# Patient Record
Sex: Female | Born: 1980 | Race: Black or African American | Hispanic: No | Marital: Single | Smoking: Never smoker
Health system: Southern US, Community
[De-identification: ages and names within clinical notes are randomized; demographics above are authoritative.]

---

## 2008-04-10 ENCOUNTER — Emergency Department: Payer: Self-pay | Admitting: Internal Medicine

## 2008-12-18 ENCOUNTER — Emergency Department: Payer: Self-pay | Admitting: Emergency Medicine

## 2010-04-12 ENCOUNTER — Emergency Department: Payer: Self-pay | Admitting: Emergency Medicine

## 2010-04-15 ENCOUNTER — Emergency Department: Payer: Self-pay | Admitting: Unknown Physician Specialty

## 2010-06-22 ENCOUNTER — Emergency Department: Payer: Self-pay | Admitting: Internal Medicine

## 2010-06-30 ENCOUNTER — Emergency Department: Payer: Self-pay | Admitting: Emergency Medicine

## 2011-09-13 ENCOUNTER — Emergency Department: Payer: Self-pay | Admitting: *Deleted

## 2011-09-13 LAB — BASIC METABOLIC PANEL
Anion Gap: 8 (ref 7–16)
BUN: 13 mg/dL (ref 7–18)
Calcium, Total: 9.3 mg/dL (ref 8.5–10.1)
Co2: 30 mmol/L (ref 21–32)
Creatinine: 0.91 mg/dL (ref 0.60–1.30)
EGFR (African American): >60
EGFR (Non-African Amer.): >60
Glucose: 92 mg/dL (ref 65–99)

## 2011-09-13 LAB — CBC
HGB: 14.6 g/dL (ref 12.0–16.0)
MCH: 27.2 pg (ref 26.0–34.0)
MCV: 83 fL (ref 80–100)
Platelet: 279 10*3/uL (ref 150–440)
RDW: 14.1 % (ref 11.5–14.5)

## 2013-10-23 IMAGING — CR DG CHEST 2V
1 series · 3 of 3 positions shown · non-contrast
Comparison: none

REASON FOR EXAM: chest pain
COMMENTS:

PROCEDURE:     DXR - DXR CHEST PA (OR AP) AND LATERAL  - September 13, 2011 [DATE]
RESULT:     The lung fields are clear.  The heart, mediastinal and osseous
structures reveal no significant abnormalities.

[Series 1: pa · 0.17mm/px · 3 of 3 slices shown]
[im 1/3]
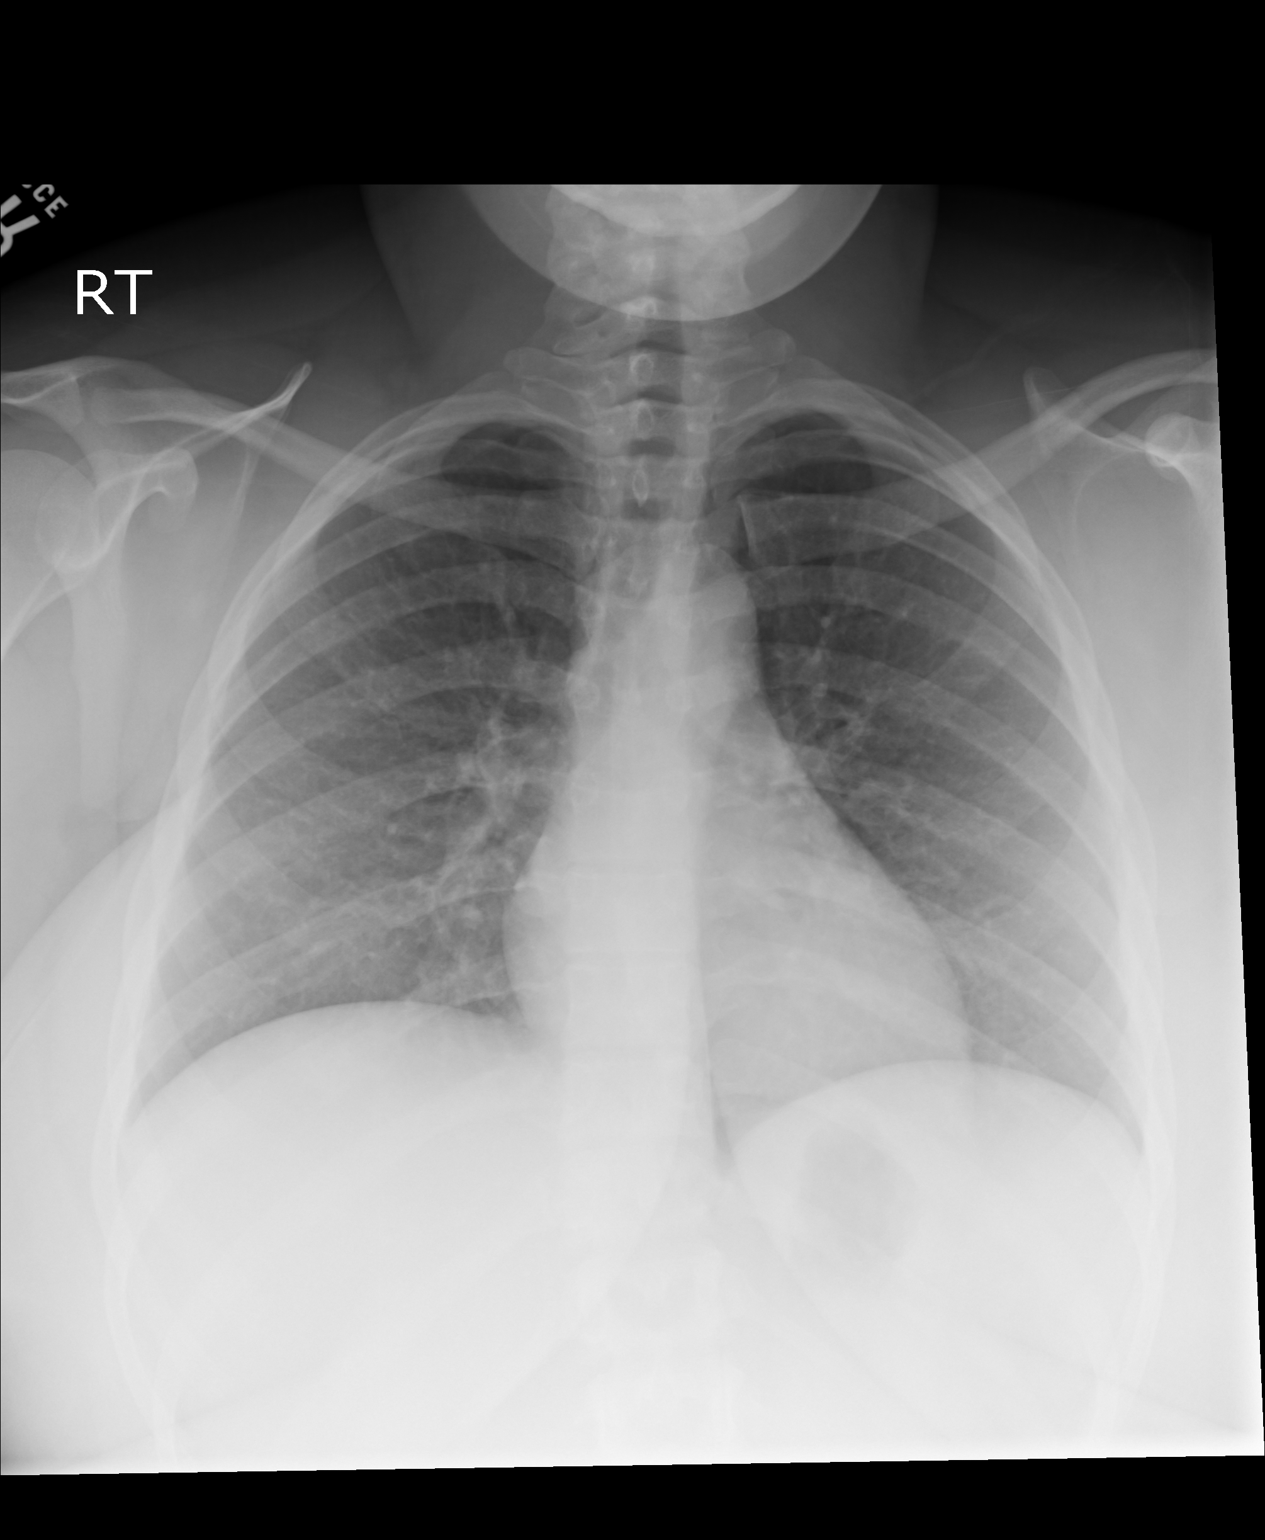
[im 2/3]
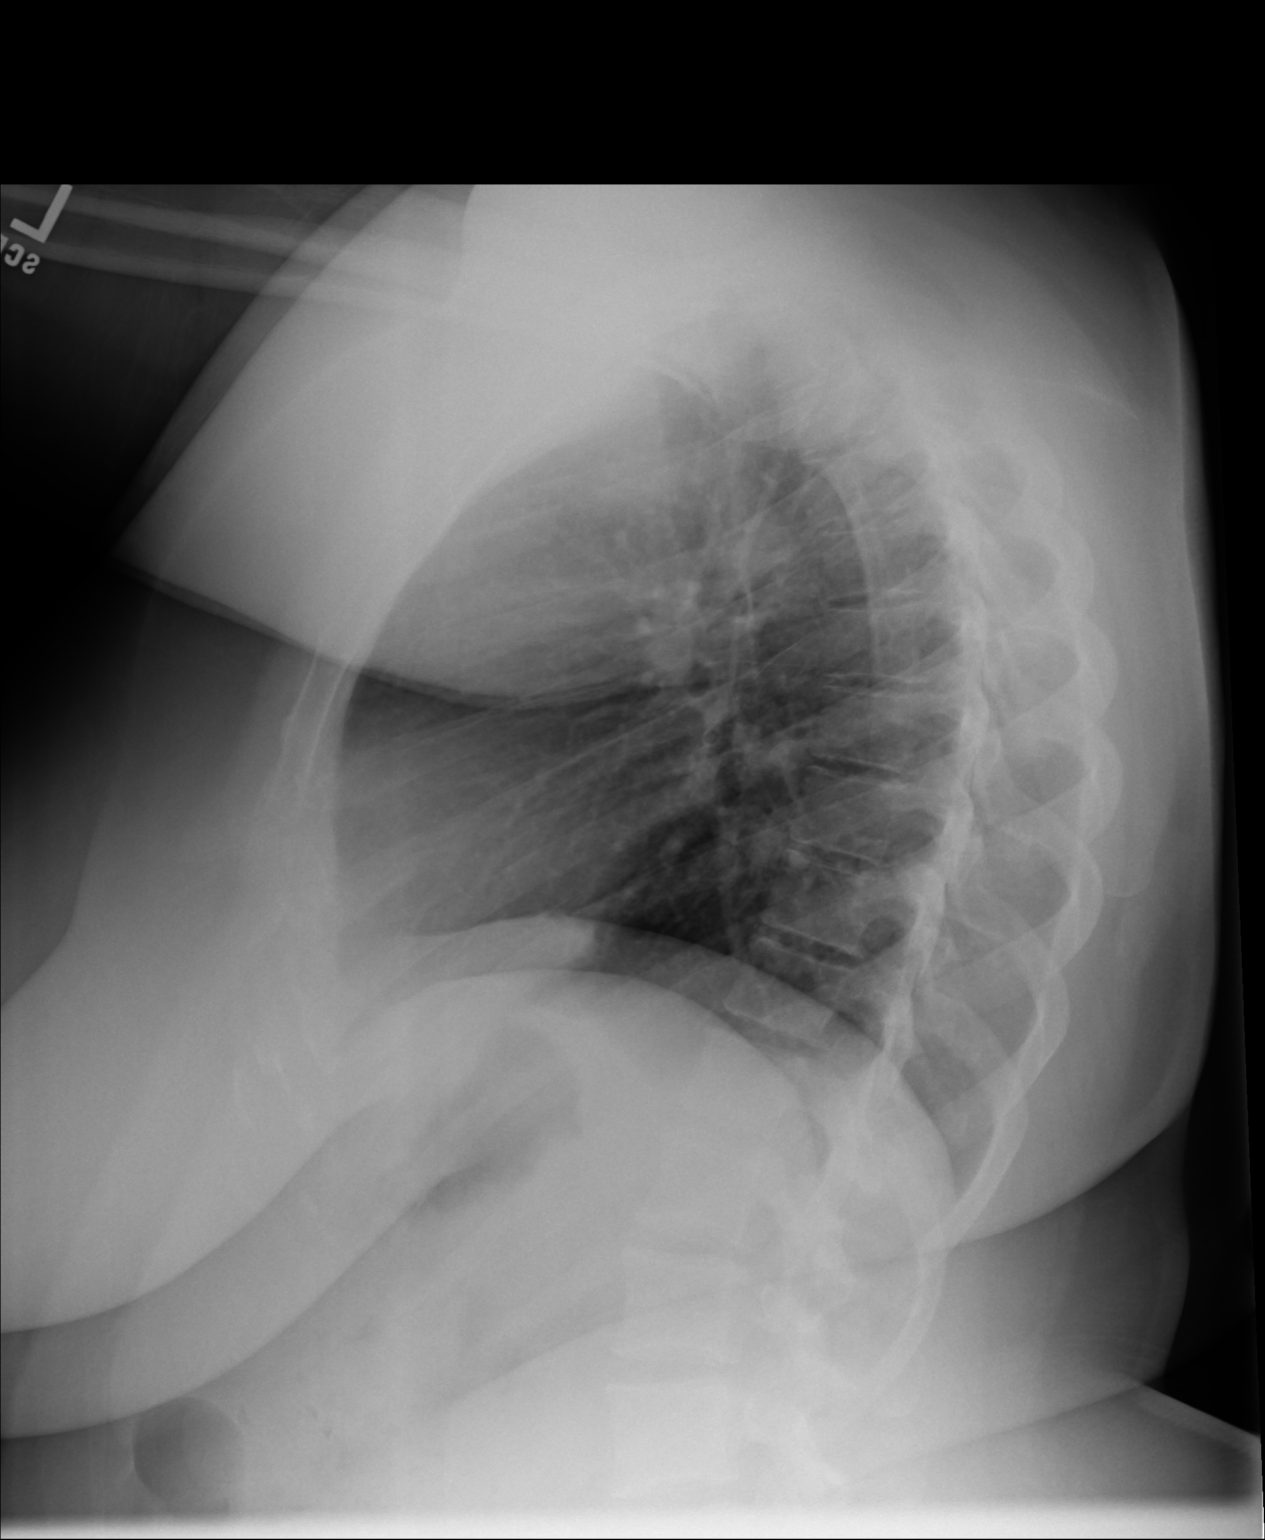
[im 3/3]
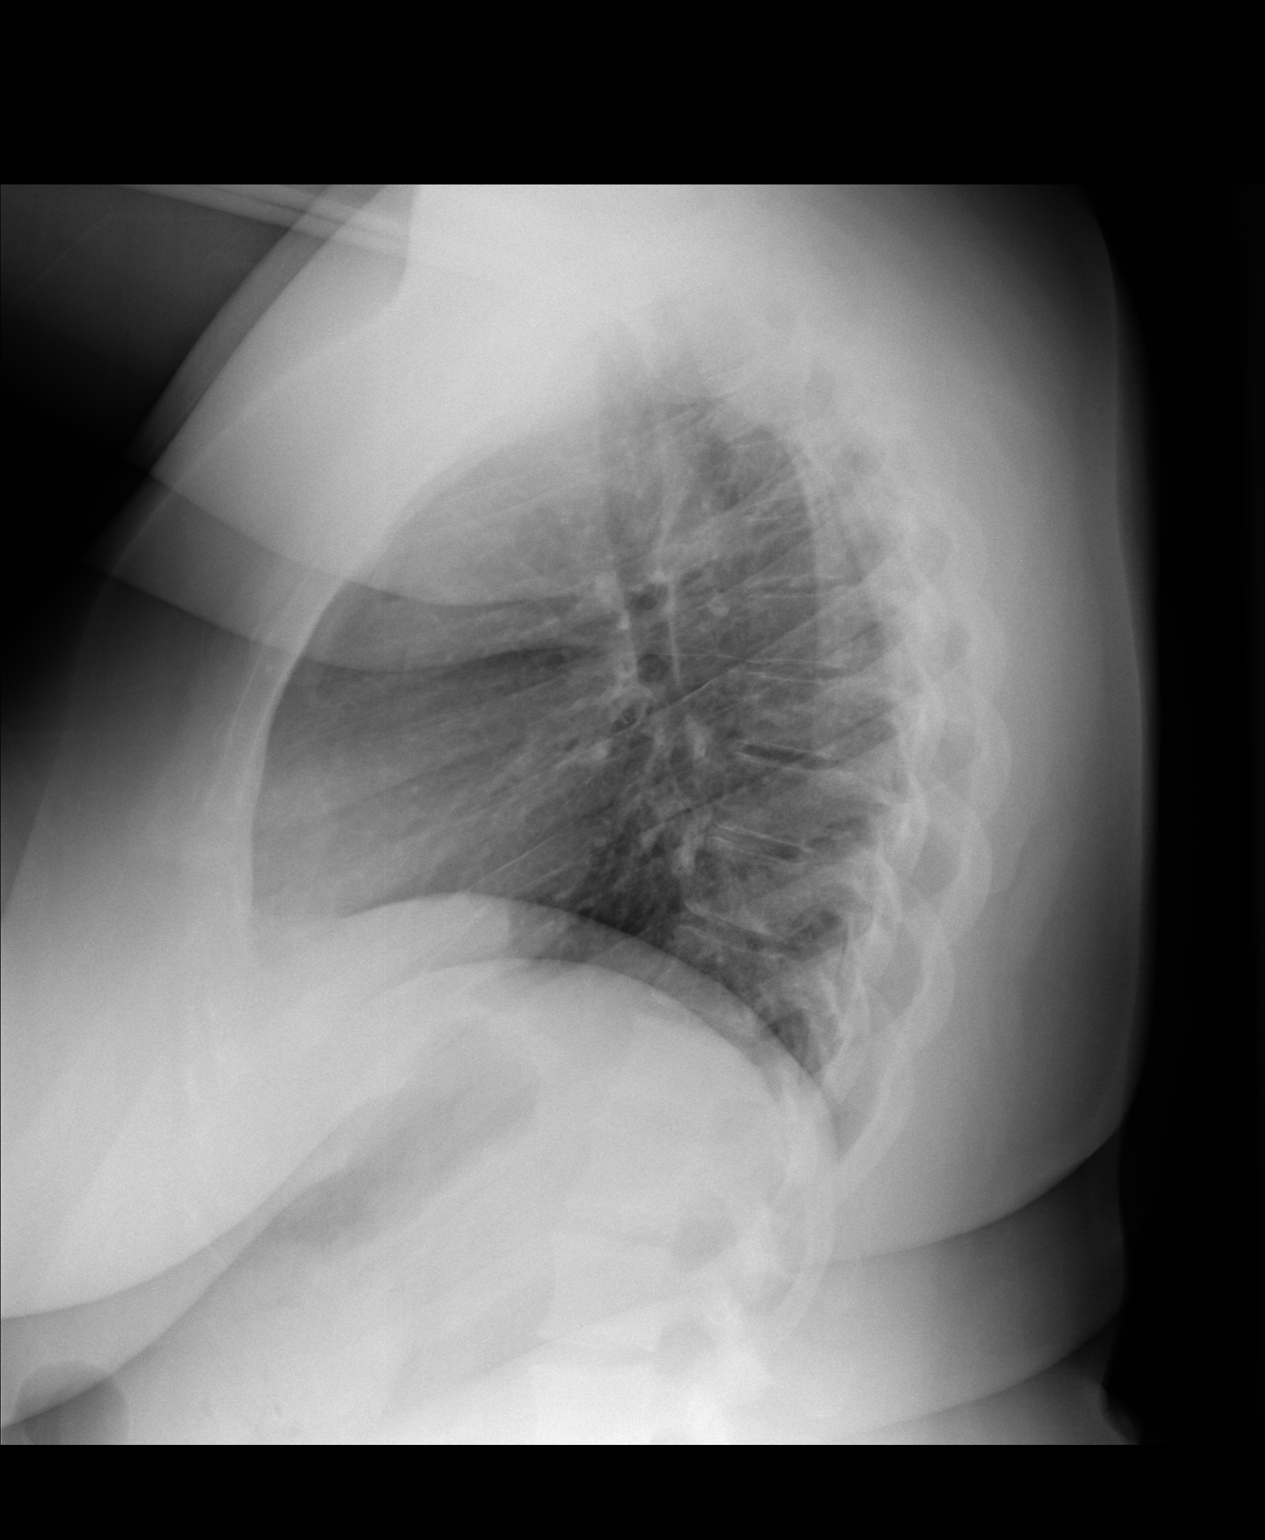

[3 of 3 positions shown; findings below may reference images not displayed]

IMPRESSION: No significant abnormalities are noted.

## 2013-10-25 ENCOUNTER — Emergency Department: Payer: Self-pay | Admitting: Emergency Medicine

## 2013-10-25 LAB — URINALYSIS, COMPLETE
Bilirubin,UR: NEGATIVE
Glucose,UR: NEGATIVE mg/dL (ref 0–75)
KETONE: NEGATIVE
LEUKOCYTE ESTERASE: NEGATIVE
Nitrite: POSITIVE
Ph: 5 (ref 4.5–8.0)
Protein: 30
SPECIFIC GRAVITY: 1.01 (ref 1.003–1.030)
Squamous Epithelial: 3

## 2015-12-26 ENCOUNTER — Emergency Department
Admission: EM | Admit: 2015-12-26 | Discharge: 2015-12-26 | Disposition: A | Discharge status: Home / Self Care | Payer: BLUE CROSS/BLUE SHIELD | Attending: Emergency Medicine | Admitting: Emergency Medicine

## 2015-12-26 ENCOUNTER — Encounter: Payer: Self-pay | Admitting: Emergency Medicine

## 2015-12-26 DIAGNOSIS — R1030 Lower abdominal pain, unspecified: Secondary | ICD-10-CM | POA: Diagnosis present

## 2015-12-26 DIAGNOSIS — N3090 Cystitis, unspecified without hematuria: Secondary | ICD-10-CM | POA: Diagnosis not present

## 2015-12-26 LAB — COMPREHENSIVE METABOLIC PANEL
ALBUMIN: 4.3 g/dL (ref 3.5–5.0)
ALK PHOS: 58 U/L (ref 38–126)
ALT: 20 U/L (ref 14–54)
AST: 22 U/L (ref 15–41)
Anion gap: 8 (ref 5–15)
BILIRUBIN TOTAL: 0.4 mg/dL (ref 0.3–1.2)
BUN: 13 mg/dL (ref 6–20)
CALCIUM: 9.4 mg/dL (ref 8.9–10.3)
CO2: 26 mmol/L (ref 22–32)
CREATININE: 0.88 mg/dL (ref 0.44–1.00)
Chloride: 103 mmol/L (ref 101–111)
GFR calc Af Amer: >60 mL/min (ref >60)
GLUCOSE: 93 mg/dL (ref 65–99)
POTASSIUM: 4.5 mmol/L (ref 3.5–5.1)
Sodium: 137 mmol/L (ref 135–145)
TOTAL PROTEIN: 8.4 g/dL — AB (ref 6.5–8.1)

## 2015-12-26 LAB — CBC
HEMATOCRIT: 44.4 % (ref 35.0–47.0)
Hemoglobin: 14.3 g/dL (ref 12.0–16.0)
MCH: 25.9 pg — ABNORMAL LOW (ref 26.0–34.0)
MCHC: 32.2 g/dL (ref 32.0–36.0)
MCV: 80.3 fL (ref 80.0–100.0)
PLATELETS: 350 10*3/uL (ref 150–440)
RBC: 5.53 MIL/uL — ABNORMAL HIGH (ref 3.80–5.20)
RDW: 14.5 % (ref 11.5–14.5)
WBC: 13.2 10*3/uL — AB (ref 3.6–11.0)

## 2015-12-26 LAB — URINALYSIS COMPLETE WITH MICROSCOPIC (ARMC ONLY)
BILIRUBIN URINE: NEGATIVE
GLUCOSE, UA: NEGATIVE mg/dL
KETONES UR: NEGATIVE mg/dL
LEUKOCYTES UA: NEGATIVE
NITRITE: NEGATIVE
PH: 7 (ref 5.0–8.0)
Protein, ur: NEGATIVE mg/dL
Specific Gravity, Urine: 1.004 — ABNORMAL LOW (ref 1.005–1.030)

## 2015-12-26 LAB — LIPASE, BLOOD: Lipase: 28 U/L (ref 11–51)

## 2015-12-26 LAB — POCT PREGNANCY, URINE: Preg Test, Ur: NEGATIVE

## 2015-12-26 MED ORDER — SULFAMETHOXAZOLE-TRIMETHOPRIM 800-160 MG PO TABS
1.0000 | ORAL_TABLET | Freq: Two times a day (BID) | ORAL | Status: AC
Start: 2015-12-26 — End: ?

## 2015-12-26 MED ORDER — ONDANSETRON 4 MG PO TBDP: 4.0000 mg | ORAL_TABLET | Freq: Three times a day (TID) | ORAL | Status: AC | PRN

## 2015-12-26 NOTE — ED Notes (Signed)
Pt alert and oriented X4, active, cooperative, pt in NAD. RR even and unlabored, color WNL.  Pt informed to return if any life threatening symptoms occur.   

## 2015-12-26 NOTE — ED Notes (Signed)
Pt to ed with c/o left flank pain and below umbilicus pain x 2 weeks,

## 2015-12-26 NOTE — ED Provider Notes (Signed)
[facility] Emergency Department Provider Note  ____________________________________________  Time seen: 2:20 PM  I have reviewed the triage vital signs and the nursing notes.   HISTORY  Chief Complaint Flank Pain    HPI Suzanne Newton is a 35 y.o. female who complains of left flank pain and lower abdominal pain in the suprapubic area at the transiently worsening over the past 2 weeks. He was initially somewhat colicky but now has become more persistent. Also start up feeling like something of a cramp but now is more of a dull ache. No nausea vomiting or diarrhea, normal bowel movements, no vaginal bleeding or discharge. States that she is prone to frequent urinary tract infections. Normal oral intake.     History reviewed. No pertinent past medical history.   There are no active problems to display for this patient.    History reviewed. No pertinent past surgical history.   Current Outpatient Rx  Name  Route  Sig  Dispense  Refill  . sulfamethoxazole-trimethoprim (BACTRIM DS) 800-160 MG tablet   Oral   Take 1 tablet by mouth 2 (two) times daily.   14 tablet   0      Allergies Review of patient's allergies indicates no known allergies.   No family history on file.  Social History Social History  Substance Use Topics  . Smoking status: Never Smoker   . Smokeless tobacco: None  . Alcohol Use: No    Review of Systems  Constitutional:   No fever or chills.  Eyes:   No vision changes.  ENT:   No sore throat. No rhinorrhea. Cardiovascular:   No chest pain. Respiratory:   No dyspnea or cough. Gastrointestinal:   Abdominal pain as above without, vomiting and diarrhea.  No bloody stool. Genitourinary:   Positive frequency. Musculoskeletal:   Negative for focal pain or swelling Neurological:   Negative for headaches 10-point ROS otherwise negative.  ____________________________________________   PHYSICAL EXAM:  VITAL SIGNS: ED  Triage Vitals  Enc Vitals Group     BP 12/26/15 1247 137/82 mmHg     Pulse Rate 12/26/15 1247 104     Resp 12/26/15 1247 20     Temp 12/26/15 1247 98 F (36.7 C)     Temp Source 12/26/15 1247 Oral     SpO2 12/26/15 1247 97 %     Weight 12/26/15 1247 260 lb (117.935 kg)     Height 12/26/15 1247 5\' 3"  (1.6 m)     Head Cir --      Peak Flow --      Pain Score 12/26/15 1249 8     Pain Loc --      Pain Edu? --      Excl. in GC? --     Vital signs reviewed, nursing assessments reviewed.   Constitutional:   Alert and oriented. Well appearing and in no distress. Eyes:   No scleral icterus. No conjunctival pallor. PERRL. EOMI ENT   Head:   Normocephalic and atraumatic.   Nose:   No congestion/rhinnorhea. No septal hematoma   Mouth/Throat:   MMM, no pharyngeal erythema. No peritonsillar mass.    Neck:   No stridor. No SubQ emphysema. No meningismus. Hematological/Lymphatic/Immunilogical:   No cervical lymphadenopathy. Cardiovascular:   RRR. Symmetric bilateral radial and DP pulses.  No murmurs.  Respiratory:   Normal respiratory effort without tachypnea nor retractions. Breath sounds are clear and equal bilaterally. No wheezes/rales/rhonchi. Gastrointestinal:   Soft with significant suprapubic tenderness and mild  bilateral lower quadrant tenderness. Non distended. There is mild left CVA tenderness.  No rebound, rigidity, or guarding. Genitourinary:   deferred Musculoskeletal:   Nontender with normal range of motion in all extremities. No joint effusions.  No lower extremity tenderness.  No edema. Neurologic:   Normal speech and language.  CN 2-10 normal. Motor grossly intact. No gross focal neurologic deficits are appreciated.  Skin:    Skin is warm, dry and intact. No rash noted.  No petechiae, purpura, or bullae.  ____________________________________________    LABS (pertinent positives/negatives) (all labs ordered are listed, but only abnormal results are  displayed) Labs Reviewed  COMPREHENSIVE METABOLIC PANEL - Abnormal; Notable for the following:    Total Protein 8.4 (*)    All other components within normal limits  CBC - Abnormal; Notable for the following:    WBC 13.2 (*)    RBC 5.53 (*)    MCH 25.9 (*)    All other components within normal limits  URINALYSIS COMPLETEWITH MICROSCOPIC ([facility] ONLY) - Abnormal; Notable for the following:    Color, Urine STRAW (*)    APPearance HAZY (*)    Specific Gravity, Urine 1.004 (*)    Hgb urine dipstick 2+ (*)    Bacteria, UA MANY (*)    Squamous Epithelial / LPF 6-30 (*)    All other components within normal limits  URINE CULTURE  LIPASE, BLOOD  POC URINE PREG, ED  POCT PREGNANCY, URINE   ____________________________________________   EKG    ____________________________________________    RADIOLOGY    ____________________________________________   PROCEDURES   ____________________________________________   INITIAL IMPRESSION / ASSESSMENT AND PLAN / ED COURSE  Pertinent labs & imaging results that were available during my care of the patient were reviewed by me and considered in my medical decision making (see chart for details).  Patient complains of worsening left flank and suprapubic pain, exam is consistent with cystitis. Labs show a slightly elevated white blood cell count of 13,000 as well as many bacteria on the urinalysis although there are not other inflammatory changes in the urine. Given her history on going ahead and treat her with antibiotics for cystitis and clinically a mild pyelonephritis. She is not in distress, nontoxic well-appearing ambulatory and energetic. We'll discharge home to follow up with primary care.     ____________________________________________   FINAL CLINICAL IMPRESSION(S) / ED DIAGNOSES  Final diagnoses:  Cystitis       Portions of this note were generated with dragon dictation software. Dictation errors may occur despite  best attempts at proofreading.   Sharman Cheek, MD 12/26/15 1500

## 2015-12-26 NOTE — Discharge Instructions (Signed)

## 2015-12-28 LAB — URINE CULTURE

## 2016-08-05 ENCOUNTER — Emergency Department: Payer: BLUE CROSS/BLUE SHIELD

## 2016-08-05 ENCOUNTER — Encounter: Payer: Self-pay | Admitting: Emergency Medicine

## 2016-08-05 ENCOUNTER — Emergency Department
Admission: EM | Admit: 2016-08-05 | Discharge: 2016-08-05 | Disposition: A | Discharge status: Home / Self Care | Payer: BLUE CROSS/BLUE SHIELD | Attending: Emergency Medicine | Admitting: Emergency Medicine

## 2016-08-05 DIAGNOSIS — Z79899 Other long term (current) drug therapy: Secondary | ICD-10-CM | POA: Insufficient documentation

## 2016-08-05 DIAGNOSIS — R1032 Left lower quadrant pain: Secondary | ICD-10-CM | POA: Diagnosis present

## 2016-08-05 DIAGNOSIS — R109 Unspecified abdominal pain: Secondary | ICD-10-CM

## 2016-08-05 LAB — BASIC METABOLIC PANEL
ANION GAP: 7 (ref 5–15)
BUN: 13 mg/dL (ref 6–20)
CALCIUM: 9.2 mg/dL (ref 8.9–10.3)
CO2: 27 mmol/L (ref 22–32)
CREATININE: 0.91 mg/dL (ref 0.44–1.00)
Chloride: 104 mmol/L (ref 101–111)
GFR calc non Af Amer: >60 mL/min (ref >60)
Glucose, Bld: 102 mg/dL — ABNORMAL HIGH (ref 65–99)
Potassium: 3.9 mmol/L (ref 3.5–5.1)
SODIUM: 138 mmol/L (ref 135–145)

## 2016-08-05 LAB — URINALYSIS COMPLETE WITH MICROSCOPIC (ARMC ONLY)
BILIRUBIN URINE: NEGATIVE
Glucose, UA: NEGATIVE mg/dL
HGB URINE DIPSTICK: NEGATIVE
Ketones, ur: NEGATIVE mg/dL
LEUKOCYTES UA: NEGATIVE
Nitrite: NEGATIVE
PROTEIN: NEGATIVE mg/dL
Specific Gravity, Urine: 1.024 (ref 1.005–1.030)
pH: 5 (ref 5.0–8.0)

## 2016-08-05 LAB — CBC
HCT: 44.4 % (ref 35.0–47.0)
HEMOGLOBIN: 14.6 g/dL (ref 12.0–16.0)
MCH: 26.9 pg (ref 26.0–34.0)
MCHC: 33 g/dL (ref 32.0–36.0)
MCV: 81.4 fL (ref 80.0–100.0)
PLATELETS: 299 10*3/uL (ref 150–440)
RBC: 5.45 MIL/uL — AB (ref 3.80–5.20)
RDW: 15.2 % — ABNORMAL HIGH (ref 11.5–14.5)
WBC: 11.3 10*3/uL — AB (ref 3.6–11.0)

## 2016-08-05 LAB — POCT PREGNANCY, URINE: PREG TEST UR: NEGATIVE

## 2016-08-05 MED ORDER — PHENAZOPYRIDINE HCL 200 MG PO TABS: 200.0000 mg | ORAL_TABLET | Freq: Three times a day (TID) | ORAL | 0 refills | Status: AC | PRN

## 2016-08-05 MED ORDER — KETOROLAC TROMETHAMINE 30 MG/ML IJ SOLN
60.0000 mg | Freq: Once | INTRAMUSCULAR | Status: AC
  Administered 2016-08-05: 60 mg via INTRAMUSCULAR
  Filled 2016-08-05: qty 2

## 2016-08-05 NOTE — ED Notes (Signed)
Md at bedside

## 2016-08-05 NOTE — ED Provider Notes (Signed)
[facility] Emergency Department Provider Note   ____________________________________________   First MD Initiated Contact with Patient 08/05/16 [phone]     (approximate)  I have reviewed the triage vital signs and the nursing notes.   HISTORY  Chief Complaint Flank Pain    HPI Suzanne Newton is a 35 y.o. female who presents to the ED from home with a chief complaint of abdominal and flank pain. Patient reports a three-week history of left flank and left lower quadrant abdominal pain. Describes a constant dull ache which is exacerbated by movement. Reports a history of cystitis and thought she had a kidney infection and so she began to drink more fluids. Denies associated fever, chills, chest pain, shortness of breath, pelvic pain, vaginal discharge, dysuria, nausea, vomiting, diarrhea. Denies recent travel or trauma.  Past Medical History None  There are no active problems to display for this patient.   History reviewed. No pertinent surgical history.  Prior to Admission medications   Medication Sig Start Date End Date Taking? Authorizing Provider  ondansetron (ZOFRAN ODT) 4 MG disintegrating tablet Take 1 tablet (4 mg total) by mouth every 8 (eight) hours as needed for nausea or vomiting. 12/26/15   Sharman CheekPhillip Stafford, MD  phenazopyridine (PYRIDIUM) 200 MG tablet Take 1 tablet (200 mg total) by mouth 3 (three) times daily as needed for pain. 08/05/16   Irean HongJade J Noreen Mackintosh, MD  sulfamethoxazole-trimethoprim (BACTRIM DS) 800-160 MG tablet Take 1 tablet by mouth 2 (two) times daily. 12/26/15   Sharman CheekPhillip Stafford, MD    Allergies Patient has no known allergies.  Family history Kidney stones  Social History Social History  Substance Use Topics  . Smoking status: Never Smoker  . Smokeless tobacco: Never Used  . Alcohol use No    Review of Systems  Constitutional: No fever/chills. Eyes: No visual changes. ENT: No sore throat. Cardiovascular: Denies chest  pain. Respiratory: Denies shortness of breath. Gastrointestinal: Positive for left flank and abdominal pain.  No nausea, no vomiting.  No diarrhea.  No constipation. Genitourinary: Negative for dysuria. Musculoskeletal: Negative for back pain. Skin: Negative for rash. Neurological: Negative for headaches, focal weakness or numbness.  10-point ROS otherwise negative.  ____________________________________________   PHYSICAL EXAM:  VITAL SIGNS: ED Triage Vitals [08/05/16 0149]  Enc Vitals Group     BP 128/85     Pulse Rate 97     Resp 18     Temp 97.9 F (36.6 C)     Temp Source Oral     SpO2 100 %     Weight 255 lb (115.7 kg)     Height 5\' 3"  (1.6 m)     Head Circumference      Peak Flow      Pain Score 7     Pain Loc      Pain Edu?      Excl. in GC?     Constitutional: Alert and oriented. Well appearing and in no acute distress. Eyes: Conjunctivae are normal. PERRL. EOMI. Head: Atraumatic. Nose: No congestion/rhinnorhea. Mouth/Throat: Mucous membranes are moist.  Oropharynx non-erythematous. Neck: No stridor.   Cardiovascular: Normal rate, regular rhythm. Grossly normal heart sounds.  Good peripheral circulation. Respiratory: Normal respiratory effort.  No retractions. Lungs CTAB. Gastrointestinal: Soft and mildly tender to palpation left lower quadrant without rebound or guarding. No distention. No abdominal bruits. Mild CVA tenderness. Musculoskeletal: No lower extremity tenderness nor edema.  No joint effusions. Neurologic:  Normal speech and language. No gross focal neurologic  deficits are appreciated. No gait instability. Skin:  Skin is warm, dry and intact. No rash noted. Psychiatric: Mood and affect are normal. Speech and behavior are normal.  ____________________________________________   LABS (all labs ordered are listed, but only abnormal results are displayed)  Labs Reviewed  URINALYSIS COMPLETEWITH MICROSCOPIC ([facility] ONLY) - Abnormal; Notable for the  following:       Result Value   Color, Urine YELLOW (*)    APPearance CLEAR (*)    Bacteria, UA RARE (*)    Squamous Epithelial / LPF 0-5 (*)    All other components within normal limits  BASIC METABOLIC PANEL - Abnormal; Notable for the following:    Glucose, Bld 102 (*)    All other components within normal limits  CBC - Abnormal; Notable for the following:    WBC 11.3 (*)    RBC 5.45 (*)    RDW 15.2 (*)    All other components within normal limits  URINE CULTURE  POC URINE PREG, ED  POCT PREGNANCY, URINE   ____________________________________________  EKG  None ____________________________________________  RADIOLOGY  CT Renal Stone Study interpreted per Dr. Chase PicketHerman: No obstructive uropathy or other acute abnormality of the abdomen or  pelvis.   ____________________________________________   PROCEDURES  Procedure(s) performed: None  Procedures  Critical Care performed: No  ____________________________________________   INITIAL IMPRESSION / ASSESSMENT AND PLAN / ED COURSE  Pertinent labs & imaging results that were available during my care of the patient were reviewed by me and considered in my medical decision making (see chart for details).  35 year old female who presents with a three-week history of left flank and left lower quadrant abdominal pain. Laboratory and urinalysis results are unremarkable. Will proceed with CT renal colic study.  Clinical Course as of Aug 05 624  Thu Aug 05, 2016  [id number] Updated patient of negative CT results. Will empirically treat for her symptoms of dysuria. Will add urine culture and start antibiotics if warranted. Strict return precautions given. Patient verbalizes understanding and agrees with plan of care.  [JS]    Clinical Course User Index [JS] Irean HongJade J Jenniferann Stuckert, MD     ____________________________________________   FINAL CLINICAL IMPRESSION(S) / ED DIAGNOSES  Final diagnoses:  Left flank pain  Left lower quadrant  pain      NEW MEDICATIONS STARTED DURING THIS VISIT:  Discharge Medication List as of 08/05/2016  5:45 AM    START taking these medications   Details  phenazopyridine (PYRIDIUM) 200 MG tablet Take 1 tablet (200 mg total) by mouth 3 (three) times daily as needed for pain., Starting Thu 08/05/2016, Print         Note:  This document was prepared using Dragon voice recognition software and may include unintentional dictation errors.    Irean HongJade J Goddess Gebbia, MD 08/05/16 [phone]

## 2016-08-05 NOTE — ED Triage Notes (Signed)
Pt ambulatory to triage without difficulty or distress noted; reports left lower abd/flank pain x 3 wks

## 2016-08-05 NOTE — ED Notes (Signed)
Patient transported to CT 

## 2016-08-05 NOTE — Discharge Instructions (Signed)
1. You may take ibuprofen as needed for discomfort. 2. Take Pyridium 3 times daily 2 days. 3. Return to the ER for worsening symptoms, persistent vomiting, difficulty breathing or other concerns.

## 2016-08-06 LAB — URINE CULTURE: SPECIAL REQUESTS: NORMAL

## 2018-05-24 ENCOUNTER — Other Ambulatory Visit: Payer: Self-pay

## 2018-05-24 ENCOUNTER — Emergency Department (HOSPITAL_BASED_OUTPATIENT_CLINIC_OR_DEPARTMENT_OTHER)
Admission: EM | Admit: 2018-05-24 | Discharge: 2018-05-24 | Disposition: A | Discharge status: Home / Self Care | Payer: Managed Care, Other (non HMO) | Attending: Emergency Medicine | Admitting: Emergency Medicine

## 2018-05-24 ENCOUNTER — Encounter (HOSPITAL_BASED_OUTPATIENT_CLINIC_OR_DEPARTMENT_OTHER): Payer: Self-pay | Admitting: Emergency Medicine

## 2018-05-24 DIAGNOSIS — X509XXA Other and unspecified overexertion or strenuous movements or postures, initial encounter: Secondary | ICD-10-CM | POA: Diagnosis not present

## 2018-05-24 DIAGNOSIS — Y939 Activity, unspecified: Secondary | ICD-10-CM | POA: Insufficient documentation

## 2018-05-24 DIAGNOSIS — Y999 Unspecified external cause status: Secondary | ICD-10-CM | POA: Diagnosis not present

## 2018-05-24 DIAGNOSIS — R1030 Lower abdominal pain, unspecified: Secondary | ICD-10-CM | POA: Diagnosis not present

## 2018-05-24 DIAGNOSIS — S29019A Strain of muscle and tendon of unspecified wall of thorax, initial encounter: Secondary | ICD-10-CM | POA: Insufficient documentation

## 2018-05-24 DIAGNOSIS — Y929 Unspecified place or not applicable: Secondary | ICD-10-CM | POA: Diagnosis not present

## 2018-05-24 DIAGNOSIS — S4992XA Unspecified injury of left shoulder and upper arm, initial encounter: Secondary | ICD-10-CM | POA: Diagnosis present

## 2018-05-24 LAB — URINALYSIS, MICROSCOPIC (REFLEX)

## 2018-05-24 LAB — WET PREP, GENITAL
Clue Cells Wet Prep HPF POC: NONE SEEN
SPERM: NONE SEEN
Trich, Wet Prep: NONE SEEN
Yeast Wet Prep HPF POC: NONE SEEN

## 2018-05-24 LAB — URINALYSIS, ROUTINE W REFLEX MICROSCOPIC
Bilirubin Urine: NEGATIVE
Glucose, UA: NEGATIVE mg/dL
KETONES UR: NEGATIVE mg/dL
LEUKOCYTES UA: NEGATIVE
NITRITE: NEGATIVE
PROTEIN: NEGATIVE mg/dL
Specific Gravity, Urine: 1.025 (ref 1.005–1.030)
pH: 6 (ref 5.0–8.0)

## 2018-05-24 LAB — PREGNANCY, URINE: Preg Test, Ur: NEGATIVE

## 2018-05-24 MED ORDER — IBUPROFEN 800 MG PO TABS
800.0000 mg | ORAL_TABLET | Freq: Once | ORAL | Status: AC
  Administered 2018-05-24: 800 mg via ORAL
  Filled 2018-05-24: qty 1

## 2018-05-24 NOTE — ED Provider Notes (Addendum)
TIME SEEN: 3:16 AM  CHIEF COMPLAINT: Left shoulder pain, left pelvic pain  HPI: Patient is a 37 year old female with history of obesity who presents to the emergency department with 2 separate complaints.  Complaining of left shoulder pain that is posterior that is worse with movement and movement of the left arm.  Pain present for the past week.  No injury that she can recall.  States she has been working out more than normal.  Has not tried any medication at home.  No numbness or focal weakness.  No chest pain or difficulty breathing.  No fever or cough.  Patient also complaining of left pelvic pain for the past week.  States it is only present when she is pushing this area.  No known history of ovarian cyst but states mother has a history of ovarian cyst.  Denies vaginal bleeding, discharge, dysuria or hematuria.  States she has had similar symptoms when she has had a urinary tract infection before.  Last menstrual period ended on August 24.  No abdominal surgery.  No nausea, vomiting or diarrhea.  No fever.  Sexually active with one female partner.  Patient is here with her boyfriend who is also being examined as a patient.  ROS: See HPI Constitutional: no fever  Eyes: no drainage  ENT: no runny nose   Cardiovascular:  no chest pain  Resp: no SOB  GI: no vomiting GU: no dysuria Integumentary: no rash  Allergy: no hives  Musculoskeletal: no leg swelling  Neurological: no slurred speech ROS otherwise negative  PAST MEDICAL HISTORY/PAST SURGICAL HISTORY:  History reviewed. No pertinent past medical history.  MEDICATIONS:  Prior to Admission medications   Medication Sig Start Date End Date Taking? Authorizing Provider  ondansetron (ZOFRAN ODT) 4 MG disintegrating tablet Take 1 tablet (4 mg total) by mouth every 8 (eight) hours as needed for nausea or vomiting. 12/26/15   Sharman CheekStafford, Phillip, MD  phenazopyridine (PYRIDIUM) 200 MG tablet Take 1 tablet (200 mg total) by mouth 3 (three) times  daily as needed for pain. 08/05/16   Irean HongSung, Jade J, MD  sulfamethoxazole-trimethoprim (BACTRIM DS) 800-160 MG tablet Take 1 tablet by mouth 2 (two) times daily. 12/26/15   Sharman CheekStafford, Phillip, MD    ALLERGIES:  No Known Allergies  SOCIAL HISTORY:  Social History   Tobacco Use  . Smoking status: Never Smoker  . Smokeless tobacco: Never Used  Substance Use Topics  . Alcohol use: No    FAMILY HISTORY: No family history on file.  EXAM: BP 133/85 (BP Location: Left Arm)   Pulse 94   Temp 98.3 F (36.8 C) (Oral)   Resp 20   Ht 5\' 3"  (1.6 m)   Wt 119.3 kg   SpO2 99%   BMI 46.59 kg/m  CONSTITUTIONAL: Alert and oriented and responds appropriately to questions. Well-appearing; well-nourished HEAD: Normocephalic EYES: Conjunctivae clear, pupils appear equal, EOMI ENT: normal nose; moist mucous membranes NECK: Supple, no meningismus, no nuchal rigidity, no LAD  CARD: RRR; S1 and S2 appreciated; no murmurs, no clicks, no rubs, no gallops RESP: Normal chest excursion without splinting or tachypnea; breath sounds clear and equal bilaterally; no wheezes, no rhonchi, no rales, no hypoxia or respiratory distress, speaking full sentences ABD/GI: Normal bowel sounds; non-distended; soft, minimally tender in the left pelvic region, no rebound, no guarding, no peritoneal signs, no hepatosplenomegaly GU:  Normal external genitalia. No lesions, rashes noted. Patient has no vaginal bleeding on exam. No vaginal discharge.  No adnexal tenderness, mass  or fullness, no cervical motion tenderness. Cervix is not appear friable.  Cervix is closed.  Chaperone present for exam. BACK:  The back appears normal and is tender over the left trapezius muscles and rhomboid without lesions present.  No midline spinal tenderness or step-off or deformity. EXT: Full range of motion of the left shoulder without pain.  No tenderness over the left shoulder.  Normal ROM in all joints; non-tender to palpation; no edema; normal  capillary refill; no cyanosis, no calf tenderness or swelling    SKIN: Normal color for age and race; warm; no rash NEURO: Moves all extremities equally PSYCH: The patient's mood and manner are appropriate. Grooming and personal hygiene are appropriate.  MEDICAL DECISION MAKING: Patient here with likely thoracic strain from working out.  Recommended alternating Tylenol and Motrin.  Ibuprofen given here in the emergency department.  Nothing to suggest PE, pneumonia.  No midline tenderness or injury to suggest fracture.  Doubt cauda equina, epidural abscess or hematoma, discitis or osteomyelitis, spinal stenosis.  Patient also complaining of lower pelvic pain.  Only present with palpation.  Patient is extremely well-appearing, smiling and laughing.  Doubt torsion.  Pregnancy test is negative.  Urine shows no blood or sign of infection.  Doubt kidney stone, pyelonephritis, UTI.  Pelvic exam is unremarkable.  No adnexal tenderness on my examination and wet prep is negative.  GC and chlamydia pending but nothing at this time suggest that she needs treatment.  Doubt TOA.  No right-sided abdominal pain to suggest appendicitis.  Doubt diverticulitis, abscess, bowel obstruction.  I feel she is safe to follow-up with the PCP as an outpatient for further work-up if pain continues.  Discussed return precautions.  She is comfortable with this plan.  At this time, I do not feel there is any life-threatening condition present. I have reviewed and discussed all results (EKG, imaging, lab, urine as appropriate) and exam findings with patient/family. I have reviewed nursing notes and appropriate previous records.  I feel the patient is safe to be discharged home without further emergent workup and can continue workup as an outpatient as needed. Discussed usual and customary return precautions. Patient/family verbalize understanding and are comfortable with this plan.  Outpatient follow-up has been provided if needed. All  questions have been answered.      Ward, Layla Maw, DO 05/24/18 [phone]     Patient just found out her partner tested positive for trichomonas.  Her wet prep was negative for trichomonas but will give her 2 g of oral Flagyl prescription.  Chlamydia and gonorrhea cultures are pending.  She is aware that if they are positive she will be contacted.  Have advised that she avoid sexual intercourse and follow-up with [facility] for HIV and syphilis testing.  I still have low suspicion for TOA at this time given she had no adnexal tenderness or mass on examination.  She was given close outpatient follow-up and return precautions were again discussed.  We discussed if symptoms are not improving that she follow-up for an outpatient transvaginal ultrasound.   Ward, Layla Maw, DO 05/24/18 0535    Ward, Layla Maw, DO 05/24/18 [id number]

## 2018-05-24 NOTE — ED Triage Notes (Signed)
Pt c/o of 6/10 left side groin pain and shoulder blade pain for about a week, pt denies any SOB, no urinary symptoms, no fever or chills.

## 2018-05-24 NOTE — Discharge Instructions (Addendum)
You may alternate Tylenol 1000 mg every 6 hours as needed for pain and Ibuprofen 800 mg every 8 hours as needed for pain.  Please take Ibuprofen with food.  Your urine showed no sign of blood or infection today.  Nothing to suggest kidney stone or kidney infection.  Your pelvic exam was also normal with normal pelvic swabs.  Please follow-up with a primary care physician if you continue to have abdominal pain.  Please return to the emergency department if your abdominal pain significantly worsens, you develop blood in your stool or black and tarry stool, vomiting and cannot stop, fever of 100.4 or higher.   To find a primary care or specialty doctor please call [phone] or [phone] to access "[facility] Find a Doctor Service."  You may also go on the [facility] website at [email or web address]  There are also multiple [facility], [facility], [facility] and [facility] practices throughout the [place] that are frequently accepting new patients. You may find a clinic that is close to your home and contact them.  [facility] -  [address] [place] [id number] [phone]   [facility] -  [address] [place] [id number] [phone]   [facility] [phone]  [place] [phone]

## 2018-05-25 LAB — GC/CHLAMYDIA PROBE AMP (~~LOC~~) NOT AT ARMC
Chlamydia: NEGATIVE
Neisseria Gonorrhea: NEGATIVE

## 2018-09-15 IMAGING — CT CT RENAL STONE PROTOCOL
2 of 3 series · 16 of 46 positions shown, 18 images · non-contrast
Comparison: None.

CLINICAL DATA: Left flank pain

EXAM:
CT ABDOMEN AND PELVIS WITHOUT CONTRAST
TECHNIQUE: Multidetector CT imaging of the abdomen and pelvis was performed
following the standard protocol without IV contrast.

[Series 2: axial st · axial · 0.84mm/px · z∈[-1026,-656]mm · 13 of 86 slices shown, 15 images]
[im 6/86  soft-tissue]
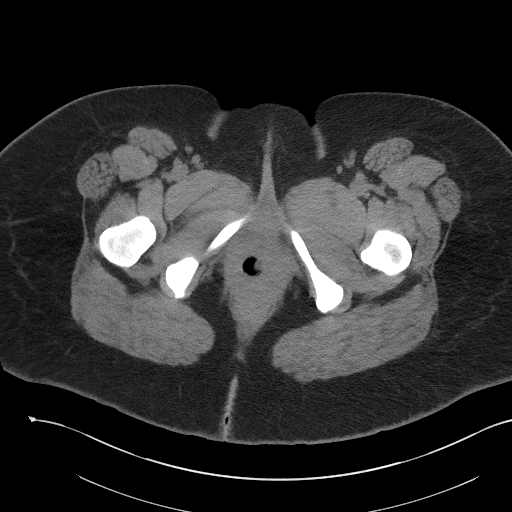
[im 6/86  bone]
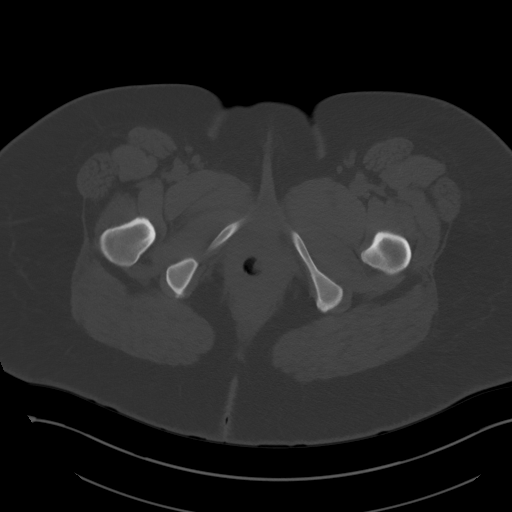
[im 11/86  soft-tissue]
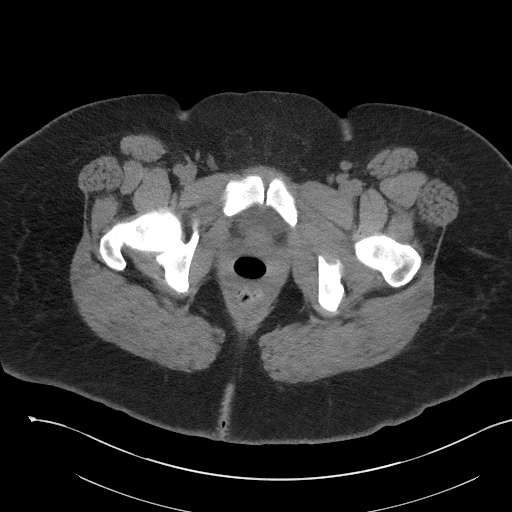
[im 17/86  soft-tissue]
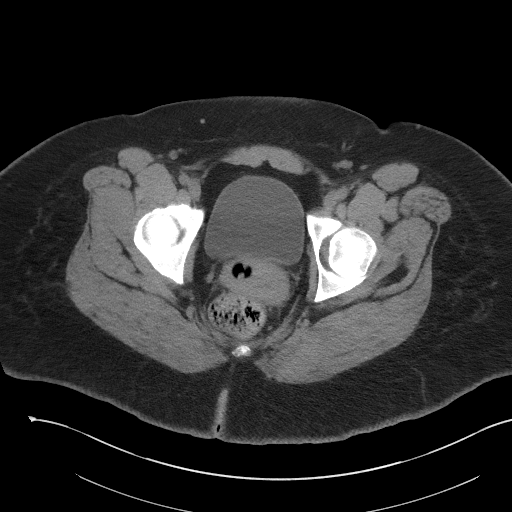
[im 25/86  soft-tissue]
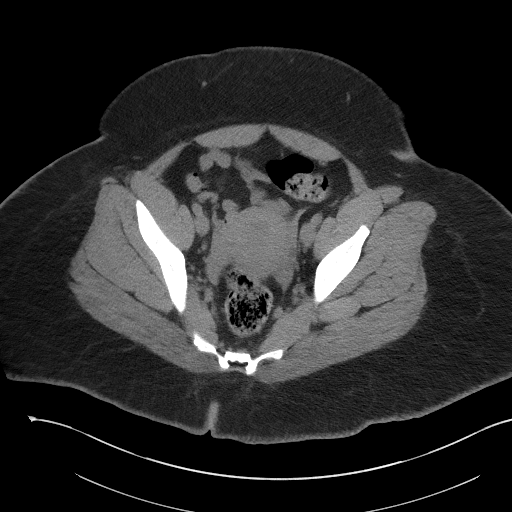
[im 31/86  soft-tissue]
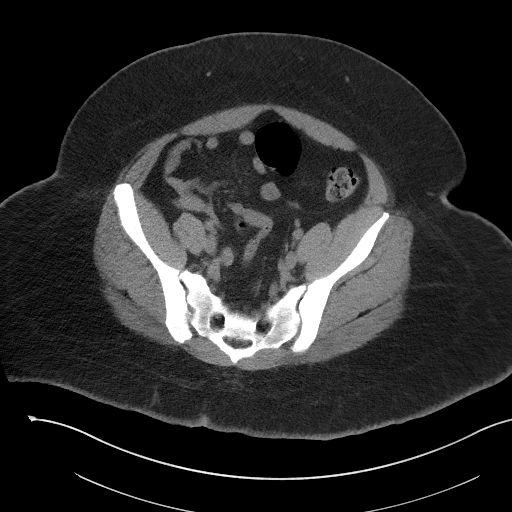
[im 36/86  soft-tissue]
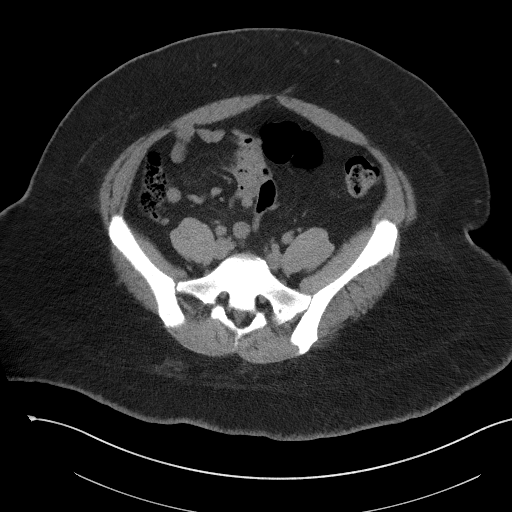
[im 44/86  soft-tissue]
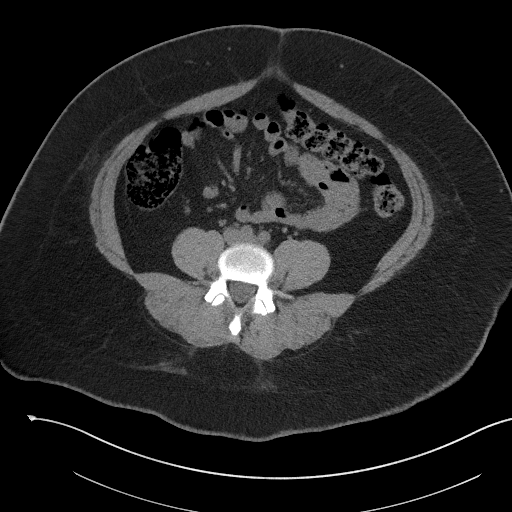
[im 50/86  soft-tissue]
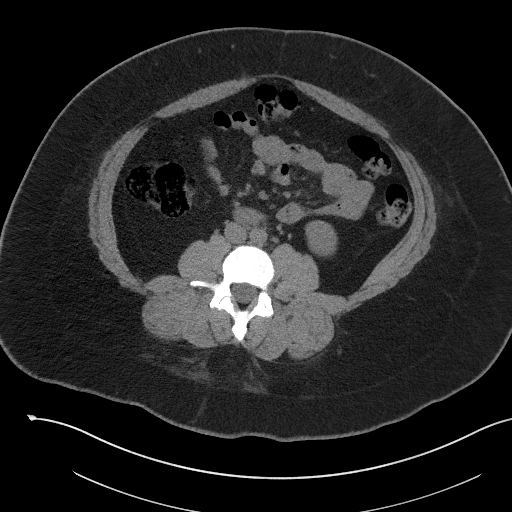
[im 55/86  soft-tissue]
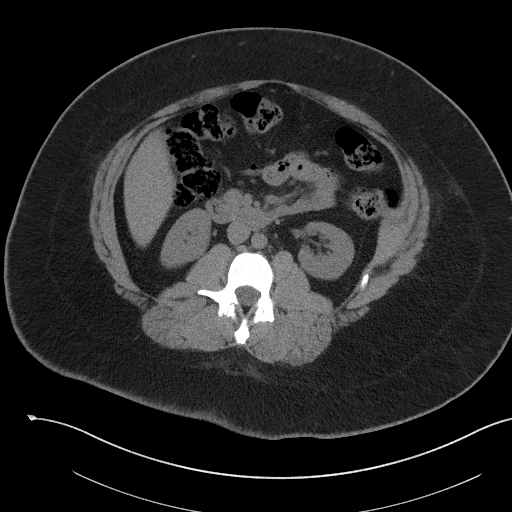
[im 55/86  bone]
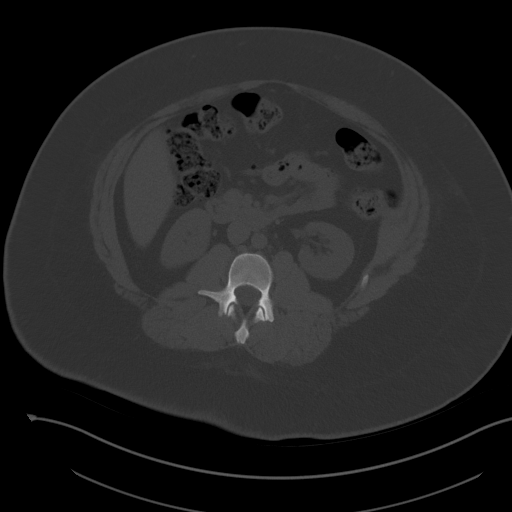
[im 61/86  soft-tissue]
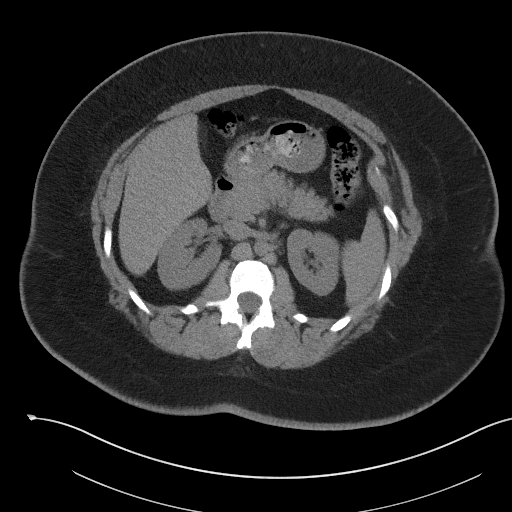
[im 69/86  soft-tissue]
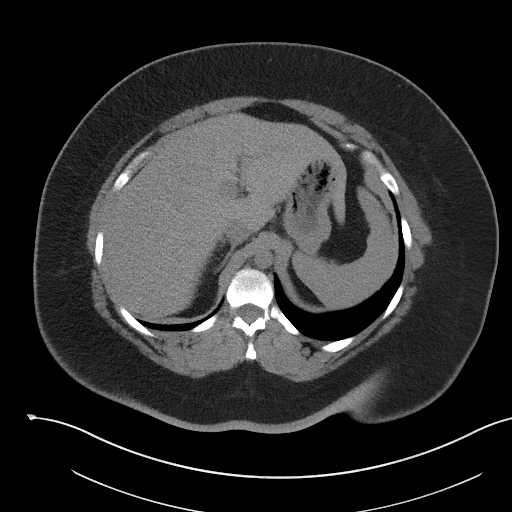
[im 75/86  soft-tissue]
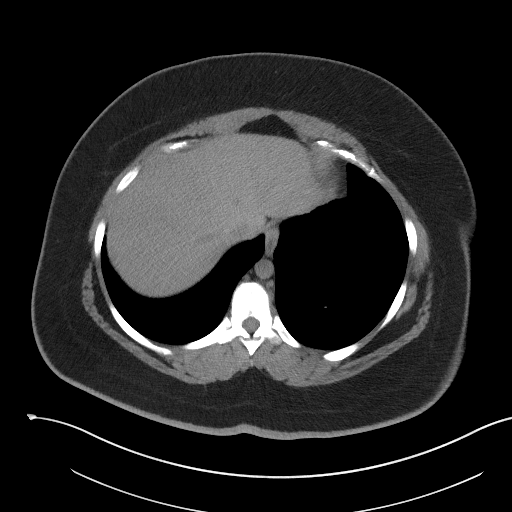
[im 80/86  soft-tissue]
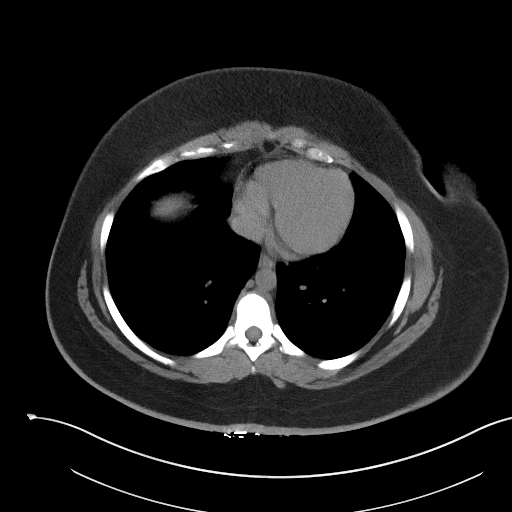

[Series 5: coronal · coronal · 0.72mm/px · 3 of 143 slices shown]
[im 48/143  soft-tissue]
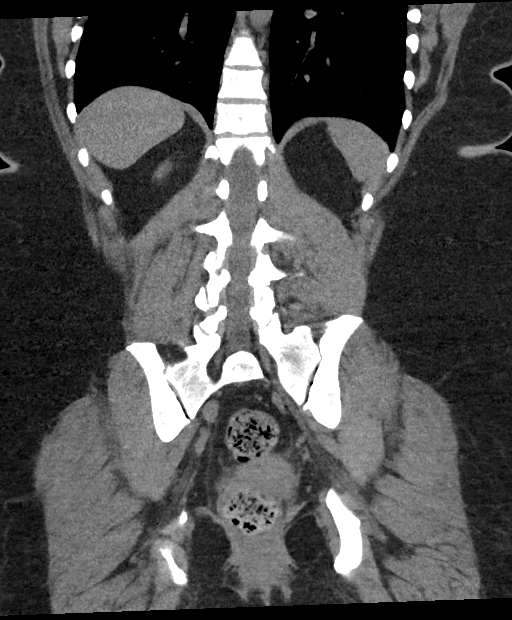
[im 64/143  soft-tissue]
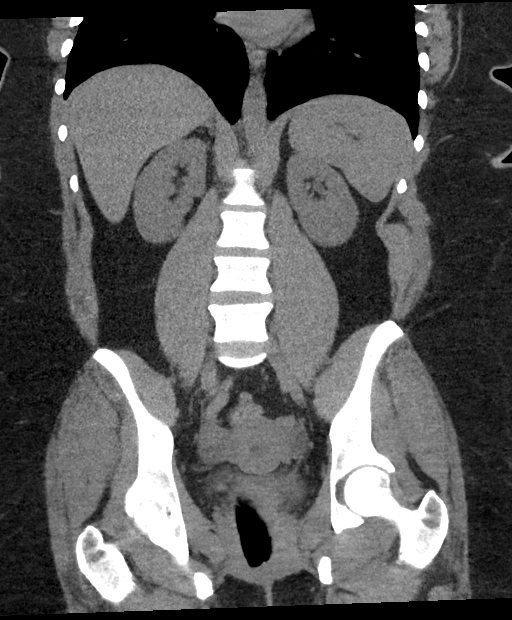
[im 79/143  soft-tissue]
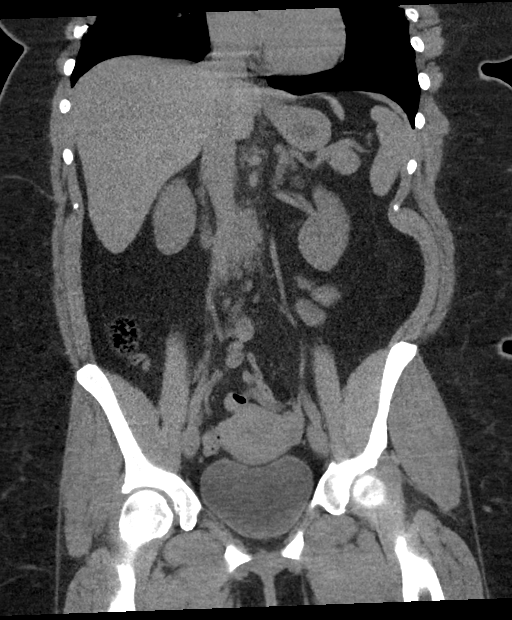

[16 of 46 positions shown; findings below may reference images not displayed]

FINDINGS: Lower chest: No pulmonary nodules or pleural effusion. No visible
pericardial effusion.

Hepatobiliary: Normal noncontrast appearance of the liver. No
visible biliary dilatation. Normal gallbladder.

Pancreas: Normal noncontrast appearance of the pancreas. No
peripancreatic fluid collection.

Spleen: Normal.

Adrenal glands: Normal.

Urinary Tract:

--Right kidney: No hydronephrosis or perinephric stranding. No
nephrolithiasis. No obstructing ureteral stones.

--Left kidney: No hydronephrosis or perinephric stranding. No
nephrolithiasis. No obstructing ureteral stones.

--Urinary bladder: Unremarkable.

Stomach/Bowel: No dilated loops of bowel. No evidence of colonic or
enteric inflammation. No fluid collection within the abdomen.

Vascular/Lymphatic: No abdominal aortic aneurysm or atherosclerotic
calcification. No abdominal or pelvic lymphadenopathy.

Reproductive: Normal uterus and ovaries.

Musculoskeletal. No focal osseous lesion. Normal visualized
extraperitoneal and extrathoracic soft tissues.
IMPRESSION: No obstructive uropathy or other acute abnormality of the abdomen or
pelvis.
